# Patient Record
Sex: Male | Born: 1985 | Race: White | Hispanic: No | Marital: Single | Smoking: Current every day smoker
Health system: Southern US, Community
[De-identification: ages and names within clinical notes are randomized; demographics above are authoritative.]

## PROBLEM LIST (undated history)

## (undated) DIAGNOSIS — I499 Cardiac arrhythmia, unspecified: Secondary | ICD-10-CM

---

## 2014-08-07 ENCOUNTER — Encounter (HOSPITAL_COMMUNITY): Payer: Self-pay | Admitting: Emergency Medicine

## 2014-08-07 ENCOUNTER — Emergency Department (HOSPITAL_COMMUNITY)
Admission: EM | Admit: 2014-08-07 | Discharge: 2014-08-07 | Disposition: A | Discharge status: Home / Self Care | Payer: Self-pay | Attending: Emergency Medicine | Admitting: Emergency Medicine

## 2014-08-07 ENCOUNTER — Emergency Department (HOSPITAL_COMMUNITY): Payer: Self-pay

## 2014-08-07 DIAGNOSIS — Z8679 Personal history of other diseases of the circulatory system: Secondary | ICD-10-CM | POA: Insufficient documentation

## 2014-08-07 DIAGNOSIS — S99919A Unspecified injury of unspecified ankle, initial encounter: Secondary | ICD-10-CM

## 2014-08-07 DIAGNOSIS — S4980XA Other specified injuries of shoulder and upper arm, unspecified arm, initial encounter: Secondary | ICD-10-CM | POA: Insufficient documentation

## 2014-08-07 DIAGNOSIS — S81009A Unspecified open wound, unspecified knee, initial encounter: Secondary | ICD-10-CM | POA: Insufficient documentation

## 2014-08-07 DIAGNOSIS — S8990XA Unspecified injury of unspecified lower leg, initial encounter: Secondary | ICD-10-CM | POA: Insufficient documentation

## 2014-08-07 DIAGNOSIS — F17200 Nicotine dependence, unspecified, uncomplicated: Secondary | ICD-10-CM | POA: Insufficient documentation

## 2014-08-07 DIAGNOSIS — S81812A Laceration without foreign body, left lower leg, initial encounter: Secondary | ICD-10-CM

## 2014-08-07 DIAGNOSIS — S91009A Unspecified open wound, unspecified ankle, initial encounter: Principal | ICD-10-CM

## 2014-08-07 DIAGNOSIS — Y92410 Unspecified street and highway as the place of occurrence of the external cause: Secondary | ICD-10-CM | POA: Insufficient documentation

## 2014-08-07 DIAGNOSIS — S46909A Unspecified injury of unspecified muscle, fascia and tendon at shoulder and upper arm level, unspecified arm, initial encounter: Secondary | ICD-10-CM | POA: Insufficient documentation

## 2014-08-07 DIAGNOSIS — S81809A Unspecified open wound, unspecified lower leg, initial encounter: Principal | ICD-10-CM

## 2014-08-07 DIAGNOSIS — IMO0002 Reserved for concepts with insufficient information to code with codable children: Secondary | ICD-10-CM | POA: Insufficient documentation

## 2014-08-07 DIAGNOSIS — S99929A Unspecified injury of unspecified foot, initial encounter: Secondary | ICD-10-CM

## 2014-08-07 DIAGNOSIS — Y9389 Activity, other specified: Secondary | ICD-10-CM | POA: Insufficient documentation

## 2014-08-07 HISTORY — DX: Cardiac arrhythmia, unspecified: I49.9

## 2014-08-07 MED ORDER — OXYCODONE-ACETAMINOPHEN 5-325 MG PO TABS: 1.0000 | ORAL_TABLET | ORAL | Status: AC | PRN

## 2014-08-07 NOTE — ED Notes (Signed)
Magazine for gun, given to security for safety
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
## 2014-08-07 NOTE — ED Provider Notes (Signed)
Medical screening examination/treatment/procedure(s) were performed by non-physician practitioner and as supervising physician I was immediately available for consultation/collaboration.   EKG Interpretation   Date/Time:  Monday August 07 2014 08:23:45 EDT Ventricular Rate:  88 PR Interval:  179 QRS Duration: 111 QT Interval:  435 QTC Calculation: 526 R Axis:   31 Text Interpretation:  Sinus rhythm Ventricular trigeminy Probable left  atrial enlargement Anterior infarct, old Nonspecific repol abnormality,  lateral leads Prolonged QT interval No previous ECGs available Confirmed  by Bebe Shaggy  MD, Rajvir Ernster ([id number]) on 08/07/2014 8:39:44 AM        Joya Gaskins, MD 08/07/14 [phone]
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
## 2014-08-07 NOTE — Discharge Instructions (Signed)
Take the prescribed medication as directed for pain control. Follow-up with urgent care in 1 week for suture removal.  Monitor wounds for signs of infection-- redness, swelling, drainage, etc. Return to the ED for new or worsening symptoms.

## 2014-08-14 ENCOUNTER — Emergency Department (INDEPENDENT_AMBULATORY_CARE_PROVIDER_SITE_OTHER)
Admission: EM | Admit: 2014-08-14 | Discharge: 2014-08-14 | Disposition: A | Discharge status: Home / Self Care | Payer: Self-pay | Source: Home / Self Care | Attending: Family Medicine | Admitting: Family Medicine

## 2014-08-14 ENCOUNTER — Encounter (HOSPITAL_COMMUNITY): Payer: Self-pay | Admitting: Emergency Medicine

## 2014-08-14 DIAGNOSIS — Z4802 Encounter for removal of sutures: Secondary | ICD-10-CM

## 2014-08-14 NOTE — Discharge Instructions (Signed)
Wash and clean regularly, cover as needed, return if any problems.

## 2014-08-14 NOTE — ED Notes (Signed)
MD evaluation only 
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
